# Patient Record
Sex: Male | Born: 1970 | Race: Black or African American | Hispanic: No | Marital: Single | Smoking: Current every day smoker
Health system: Southern US, Community
[De-identification: ages and names within clinical notes are randomized; demographics above are authoritative.]

## PROBLEM LIST (undated history)

## (undated) DIAGNOSIS — F329 Major depressive disorder, single episode, unspecified: Secondary | ICD-10-CM

## (undated) DIAGNOSIS — F32A Depression, unspecified: Secondary | ICD-10-CM

## (undated) DIAGNOSIS — F1010 Alcohol abuse, uncomplicated: Secondary | ICD-10-CM

---

## 2013-12-09 ENCOUNTER — Emergency Department (HOSPITAL_COMMUNITY): Payer: BC Managed Care – PPO

## 2013-12-09 ENCOUNTER — Emergency Department (HOSPITAL_COMMUNITY)
Admission: EM | Admit: 2013-12-09 | Discharge: 2013-12-11 | Disposition: A | Discharge status: Home / Self Care | Payer: BC Managed Care – PPO | Attending: Emergency Medicine | Admitting: Emergency Medicine

## 2013-12-09 ENCOUNTER — Encounter (HOSPITAL_COMMUNITY): Payer: Self-pay | Admitting: Emergency Medicine

## 2013-12-09 DIAGNOSIS — R45851 Suicidal ideations: Secondary | ICD-10-CM | POA: Diagnosis present

## 2013-12-09 DIAGNOSIS — F10129 Alcohol abuse with intoxication, unspecified: Secondary | ICD-10-CM | POA: Insufficient documentation

## 2013-12-09 DIAGNOSIS — Z4802 Encounter for removal of sutures: Secondary | ICD-10-CM | POA: Diagnosis not present

## 2013-12-09 DIAGNOSIS — Z720 Tobacco use: Secondary | ICD-10-CM | POA: Diagnosis not present

## 2013-12-09 DIAGNOSIS — F10239 Alcohol dependence with withdrawal, unspecified: Secondary | ICD-10-CM | POA: Diagnosis present

## 2013-12-09 DIAGNOSIS — F1010 Alcohol abuse, uncomplicated: Secondary | ICD-10-CM

## 2013-12-09 DIAGNOSIS — F329 Major depressive disorder, single episode, unspecified: Secondary | ICD-10-CM | POA: Insufficient documentation

## 2013-12-09 DIAGNOSIS — F1994 Other psychoactive substance use, unspecified with psychoactive substance-induced mood disorder: Secondary | ICD-10-CM | POA: Diagnosis present

## 2013-12-09 DIAGNOSIS — F10939 Alcohol use, unspecified with withdrawal, unspecified: Secondary | ICD-10-CM | POA: Diagnosis present

## 2013-12-09 DIAGNOSIS — R0902 Hypoxemia: Secondary | ICD-10-CM | POA: Diagnosis not present

## 2013-12-09 DIAGNOSIS — Z79899 Other long term (current) drug therapy: Secondary | ICD-10-CM | POA: Diagnosis not present

## 2013-12-09 HISTORY — DX: Major depressive disorder, single episode, unspecified: F32.9

## 2013-12-09 HISTORY — DX: Alcohol abuse, uncomplicated: F10.10

## 2013-12-09 HISTORY — DX: Depression, unspecified: F32.A

## 2013-12-09 LAB — CBC
HEMATOCRIT: 38.4 % — AB (ref 39.0–52.0)
HEMOGLOBIN: 12.9 g/dL — AB (ref 13.0–17.0)
MCH: 27.2 pg (ref 26.0–34.0)
MCHC: 33.6 g/dL (ref 30.0–36.0)
MCV: 81 fL (ref 78.0–100.0)
Platelets: 281 10*3/uL (ref 150–400)
RBC: 4.74 MIL/uL (ref 4.22–5.81)
RDW: 13.4 % (ref 11.5–15.5)
WBC: 6.5 10*3/uL (ref 4.0–10.5)

## 2013-12-09 LAB — COMPREHENSIVE METABOLIC PANEL
ALK PHOS: 60 U/L (ref 39–117)
ALT: 22 U/L (ref 0–53)
AST: 30 U/L (ref 0–37)
Albumin: 4.2 g/dL (ref 3.5–5.2)
Anion gap: 20 — ABNORMAL HIGH (ref 5–15)
BUN: 9 mg/dL (ref 6–23)
CO2: 26 mEq/L (ref 19–32)
Calcium: 9 mg/dL (ref 8.4–10.5)
Chloride: 105 mEq/L (ref 96–112)
Creatinine, Ser: 0.86 mg/dL (ref 0.50–1.35)
GFR calc non Af Amer: >90 mL/min (ref >90)
GLUCOSE: 90 mg/dL (ref 70–99)
POTASSIUM: 4 meq/L (ref 3.7–5.3)
SODIUM: 151 meq/L — AB (ref 137–147)
Total Bilirubin: <0.2 mg/dL — ABNORMAL LOW (ref 0.3–1.2)
Total Protein: 7.4 g/dL (ref 6.0–8.3)

## 2013-12-09 LAB — SALICYLATE LEVEL: Salicylate Lvl: <2 mg/dL — ABNORMAL LOW (ref 2.8–20.0)

## 2013-12-09 LAB — RAPID URINE DRUG SCREEN, HOSP PERFORMED
AMPHETAMINES: NOT DETECTED
BARBITURATES: NOT DETECTED
Benzodiazepines: NOT DETECTED
Cocaine: NOT DETECTED
Opiates: NOT DETECTED
TETRAHYDROCANNABINOL: NOT DETECTED

## 2013-12-09 LAB — I-STAT CHEM 8, ED
BUN: 7 mg/dL (ref 6–23)
Calcium, Ion: 1.08 mmol/L — ABNORMAL LOW (ref 1.12–1.23)
Chloride: 98 mEq/L (ref 96–112)
Creatinine, Ser: 1.1 mg/dL (ref 0.50–1.35)
Glucose, Bld: 98 mg/dL (ref 70–99)
HEMATOCRIT: 39 % (ref 39.0–52.0)
HEMOGLOBIN: 13.3 g/dL (ref 13.0–17.0)
POTASSIUM: 3.7 meq/L (ref 3.7–5.3)
Sodium: 143 mEq/L (ref 137–147)
TCO2: 27 mmol/L (ref 0–100)

## 2013-12-09 LAB — ETHANOL: Alcohol, Ethyl (B): 317 mg/dL — ABNORMAL HIGH (ref 0–11)

## 2013-12-09 LAB — ACETAMINOPHEN LEVEL

## 2013-12-09 MED ORDER — ONDANSETRON HCL 4 MG PO TABS
4.0000 mg | ORAL_TABLET | Freq: Three times a day (TID) | ORAL | Status: DC | PRN
  Administered 2013-12-10: 4 mg via ORAL
  Filled 2013-12-09: qty 1

## 2013-12-09 MED ORDER — IBUPROFEN 200 MG PO TABS
600.0000 mg | ORAL_TABLET | Freq: Three times a day (TID) | ORAL | Status: DC | PRN
  Administered 2013-12-09 – 2013-12-11 (×4): 600 mg via ORAL
  Filled 2013-12-09 (×4): qty 3

## 2013-12-09 MED ORDER — ZOLPIDEM TARTRATE 5 MG PO TABS: 5.0000 mg | ORAL_TABLET | Freq: Every evening | ORAL | Status: DC | PRN

## 2013-12-09 MED ORDER — ONDANSETRON 4 MG PO TBDP
4.0000 mg | ORAL_TABLET | Freq: Once | ORAL | Status: AC
  Administered 2013-12-09: 4 mg via ORAL
  Filled 2013-12-09: qty 1

## 2013-12-09 MED ORDER — LORAZEPAM 1 MG PO TABS
1.0000 mg | ORAL_TABLET | Freq: Once | ORAL | Status: AC
  Administered 2013-12-09: 1 mg via ORAL
  Filled 2013-12-09: qty 1

## 2013-12-09 MED ORDER — LORAZEPAM 1 MG PO TABS
0.0000 mg | ORAL_TABLET | Freq: Four times a day (QID) | ORAL | Status: DC
  Administered 2013-12-09: 1 mg via ORAL
  Administered 2013-12-10: 2 mg via ORAL
  Administered 2013-12-10: 1 mg via ORAL
  Filled 2013-12-09: qty 2
  Filled 2013-12-09 (×2): qty 1

## 2013-12-09 MED ORDER — ACETAMINOPHEN 325 MG PO TABS
650.0000 mg | ORAL_TABLET | ORAL | Status: DC | PRN
  Administered 2013-12-10 (×3): 650 mg via ORAL
  Filled 2013-12-09 (×3): qty 2

## 2013-12-09 MED ORDER — ALUM & MAG HYDROXIDE-SIMETH 200-200-20 MG/5ML PO SUSP: 30.0000 mL | ORAL | Status: DC | PRN

## 2013-12-09 MED ORDER — NICOTINE 21 MG/24HR TD PT24
21.0000 mg | MEDICATED_PATCH | Freq: Every day | TRANSDERMAL | Status: DC
  Administered 2013-12-09 – 2013-12-10 (×2): 21 mg via TRANSDERMAL
  Filled 2013-12-09 (×2): qty 1

## 2013-12-09 MED ORDER — METHOCARBAMOL 500 MG PO TABS
1000.0000 mg | ORAL_TABLET | Freq: Once | ORAL | Status: AC
  Administered 2013-12-09: 1000 mg via ORAL
  Filled 2013-12-09: qty 2

## 2013-12-09 MED ORDER — GABAPENTIN 300 MG PO CAPS
300.0000 mg | ORAL_CAPSULE | Freq: Three times a day (TID) | ORAL | Status: DC
  Administered 2013-12-09 – 2013-12-11 (×6): 300 mg via ORAL
  Filled 2013-12-09 (×7): qty 1

## 2013-12-09 MED ORDER — LORAZEPAM 1 MG PO TABS: 0.0000 mg | ORAL_TABLET | Freq: Two times a day (BID) | ORAL | Status: DC

## 2013-12-09 MED ORDER — NICOTINE 14 MG/24HR TD PT24
14.0000 mg | MEDICATED_PATCH | Freq: Every day | TRANSDERMAL | Status: DC
  Administered 2013-12-11: 14 mg via TRANSDERMAL
  Filled 2013-12-09 (×2): qty 1

## 2013-12-09 NOTE — BH Assessment (Signed)
Assessment completed. Consulted Nanine MeansJamison Lord, NP who agrees that pt needs inpatient treatment. [facility] at capacity. TTS will contact other facilities for placement. Joni ReiningNicole Pisciotta, PA-C has been informed of the recommendation.

## 2013-12-09 NOTE — ED Notes (Signed)
Pt stayed between 99-97% while ambulating.
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
## 2013-12-09 NOTE — ED Provider Notes (Signed)
Medical screening examination/treatment/procedure(s) were performed by non-physician practitioner and as supervising physician I was immediately available for consultation/collaboration.   EKG Interpretation   Date/Time:  Saturday December 09 2013 17:07:16 EST Ventricular Rate:  84 PR Interval:  138 QRS Duration: 97 QT Interval:  377 QTC Calculation: 446 R Axis:   22 Text Interpretation:  Sinus rhythm No old tracing to compare Confirmed by  [facility]  MD, TREY ([id number]) on 12/09/2013 5:50:32 PM      \43 year old male with history of alcoholism who presents with suicidal ideation and request for alcohol detox.On exam, well appearing, nontoxic, not distressed, normal respiratory effort, normal perfusion, depressed mood, withdrawn.  He has had some slight hypoxia, which appears secondary to alcohol intoxication, when stimulated, speaking, or ambulating, his O2 sats are normal.  Plan psych eval.   Clinical Impression: 1. Alcohol abuse   2. Hypoxic   3. Suicidal intent       Warnell Foresterrey Audi Conover, MD 12/09/13 [phone]

## 2013-12-09 NOTE — ED Provider Notes (Signed)
CSN: [id number]     Arrival date & time 12/09/13  1513 History   First MD Initiated Contact with Patient 12/09/13 1608     Chief Complaint  Patient presents with  . etoh detox   . Suicidal     (Consider location/radiation/quality/duration/timing/severity/associated sxs/prior Treatment) HPI   Evan [name] is a 43 y.o. male requesting detox from alcohol. 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 Patient is also complaining of low back pain which is chronic, rated at 7 out of 10, exacerbated by movement and palpation. Patient denies suicidal ideation however, triage note states that he is suicidal with plan to  jump in front of a train. He denies auditory or visual hallucinations, illicit drug use, chest pain, shortness of breath, homicidal ideation bowel or bladder habits. Reports productive cough.   Patient states he has a cyst removed from left neck approximately 2 weeks ago in [place], no complaints with respect to incision site  Past Medical History  Diagnosis Date  . Alcohol abuse    No past surgical history on file. No family history on file. History  Substance Use Topics  . Smoking status: Current Every Day Smoker  . Smokeless tobacco: Not on file  . Alcohol Use: Yes     Comment: 4-5 pints a day    Review of Systems  10 systems reviewed and found to be negative, except as noted in the HPI.   Allergies  Review of patient's allergies indicates no known allergies.  Home Medications   Prior to Admission medications   Medication Sig Start Date End Date Taking? Authorizing Provider  gabapentin (NEURONTIN) 300 MG capsule Take 300 mg by mouth 3 (three) times daily.   Yes Historical Provider, MD   BP 119/69 mmHg  Pulse 85  Temp(Src) 97.3 F (36.3 C) (Oral)  Resp 20  Ht 6\' 1"  (1.854 m)  Wt 190 lb (86.183 kg)   BMI 25.07 kg/m2  SpO2 91% Physical Exam  Constitutional: He is oriented to person, place, and time. He appears well-developed and well-nourished. No distress.  HENT:  Head: Normocephalic and atraumatic.  Mouth/Throat: Oropharynx is clear and moist.  + Tongue fasciculations   Eyes: Conjunctivae and EOM are normal. Pupils are equal, round, and reactive to light.  Neck:  6 cm well-healing incision to left lateral neck, aches running Ethilon sutures in place, clean dry intact no induration tenderness or discharge.  Cardiovascular: Normal rate.   Pulmonary/Chest: Effort normal. No stridor.  Musculoskeletal: Normal range of motion.  Neurological: He is alert and oriented to person, place, and time.  Psychiatric: His affect is blunt. His speech is delayed. He is slowed and withdrawn. He exhibits a depressed mood. He expresses suicidal ideation. He expresses suicidal plans. He is noncommunicative.  Nursing note and vitals reviewed.   ED Course  SUTURE REMOVAL Date/Time: 12/09/2013 4:59 PM Performed by: Wynetta Emery Authorized by: Wynetta Emery Consent: Verbal consent obtained. Consent given by: patient Patient identity confirmed: verbally with patient Body area: head/neck Wound Appearance: clean Sutures Removed: 8 Patient tolerance: Patient tolerated the procedure well with no immediate complications   (including critical care time) Labs Review Labs Reviewed  CBC - Abnormal; Notable for the following:    Hemoglobin 12.9 (*)    HCT 38.4 (*)    All other components within normal limits  COMPREHENSIVE METABOLIC PANEL - Abnormal; Notable for the following:    Sodium 151 (*)    Total Bilirubin <0.2 (*)    Anion gap 20 (*)    All other components within normal limits  ETHANOL - Abnormal; Notable for the following:    Alcohol, Ethyl (B) 317 (*)    All other components within normal limits  SALICYLATE LEVEL - Abnormal; Notable for the following:    Salicylate Lvl <2.0 (*)     All other components within normal limits  I-STAT CHEM 8, ED - Abnormal; Notable for the following:    Calcium, Ion 1.08 (*)    All other components within normal limits  ACETAMINOPHEN LEVEL  URINE RAPID DRUG SCREEN (HOSP PERFORMED)    Imaging Review Dg Chest Port 1 View  12/09/2013   CLINICAL DATA:  Hypoxia today, initial evaluation, no cardiopulmonary history  EXAM: PORTABLE CHEST - 1 VIEW  COMPARISON:  None.  FINDINGS: The heart size and mediastinal contours are within normal limits. Both lungs are clear. The visualized skeletal structures are unremarkable.  IMPRESSION: No active disease.   Electronically Signed   By: Esperanza Heiraymond  Rubner M.D.   On: 12/09/2013 17:47     EKG Interpretation   Date/Time:  Saturday December 09 2013 17:07:16 EST Ventricular Rate:  84 PR Interval:  138 QRS Duration: 97 QT Interval:  377 QTC Calculation: 446 R Axis:   22 Text Interpretation:  Sinus rhythm No old tracing to compare Confirmed by  [facility]  MD, TREY ([id number]) on 12/09/2013 5:50:32 PM      MDM   Final diagnoses:  Hypoxic  Alcohol abuse  Suicidal intent    Filed Vitals:   12/09/13 1800 12/09/13 1900 12/09/13 1930 12/09/13 2000  BP: 125/64 121/62 123/63 119/69  Pulse: 96 98 98 85  Temp:      TempSrc:      Resp: 18 23 19 20   Height:      Weight:      SpO2: 95% 90% 89% 91%    Medications  nicotine (NICODERM CQ - dosed in mg/24 hours) patch 14 mg (14 mg Transdermal Not Given 12/09/13 1654)  alum & mag hydroxide-simeth (MAALOX/MYLANTA) 200-200-20 MG/5ML suspension 30 mL (not administered)  ondansetron (ZOFRAN) tablet 4 mg (not administered)  nicotine (NICODERM CQ - dosed in mg/24 hours) patch 21 mg (not administered)  zolpidem (AMBIEN) tablet 5 mg (not administered)  ibuprofen (ADVIL,MOTRIN) tablet 600 mg (not administered)  acetaminophen (TYLENOL) tablet 650 mg (not administered)  LORazepam (ATIVAN) tablet 0-4 mg (not administered)    Followed by  LORazepam (ATIVAN) tablet 0-4  mg (not administered)  gabapentin (NEURONTIN) capsule 300 mg (not administered)  methocarbamol (ROBAXIN) tablet 1,000 mg (1,000 mg Oral Given 12/09/13 1650)  ondansetron (ZOFRAN-ODT) disintegrating tablet 4 mg (4 mg Oral Given 12/09/13 1651)  LORazepam (ATIVAN) tablet 1 mg (1 mg Oral Given 12/09/13 1649)    Evan [name] is a 43 y.o. male presenting with SI with plan and Requesting detox from alcohol.patient became hypoxic to 88% while lying in bed. He is placed on oxygen and responded well, chest x-ray pending. I have removed oxygen patient remained about 94%, will ambulate with evaluation of O2 sat.   Patient maintains a saturation of ambulation. Alcohol is elevated at 300. Patient has an anion gap of 20, likely secondary to dehydration. Recheck on i-STAT chem 8 shows that the gap is closing to 18  After patient has ate and drank.  Patient is medically cleared for psychiatric  evaluation will be transferred to the psych ED. TTS consulted, home meds and psych standard holding orders placed.       Wynetta Emeryicole Ulysses Alper, PA-C 12/09/13 2014  Warnell Foresterrey Wofford, MD 12/09/13 2049

## 2013-12-09 NOTE — ED Notes (Signed)
Pt states that he normally drinks 4-5 pints of etoh.  Pt states that he is suicidal with plan to jump in front of train.  Pt appears paranoid in triage.  Pt is concerned that this writer is typing about him.  Denies HI.  Last drink was around 11 am today.

## 2013-12-10 DIAGNOSIS — F1024 Alcohol dependence with alcohol-induced mood disorder: Secondary | ICD-10-CM

## 2013-12-10 MED ORDER — VITAMIN B-1 100 MG PO TABS
100.0000 mg | ORAL_TABLET | Freq: Every day | ORAL | Status: DC
  Administered 2013-12-11: 100 mg via ORAL
  Filled 2013-12-10: qty 1

## 2013-12-10 MED ORDER — CHLORDIAZEPOXIDE HCL 25 MG PO CAPS: 25.0000 mg | ORAL_CAPSULE | Freq: Three times a day (TID) | ORAL | Status: DC

## 2013-12-10 MED ORDER — CHLORDIAZEPOXIDE HCL 25 MG PO CAPS: 25.0000 mg | ORAL_CAPSULE | ORAL | Status: DC

## 2013-12-10 MED ORDER — CHLORDIAZEPOXIDE HCL 25 MG PO CAPS
25.0000 mg | ORAL_CAPSULE | Freq: Four times a day (QID) | ORAL | Status: DC | PRN
  Administered 2013-12-11: 25 mg via ORAL
  Filled 2013-12-10: qty 1

## 2013-12-10 MED ORDER — THIAMINE HCL 100 MG/ML IJ SOLN
100.0000 mg | Freq: Once | INTRAMUSCULAR | Status: AC
  Administered 2013-12-10: 100 mg via INTRAMUSCULAR
  Filled 2013-12-10: qty 2

## 2013-12-10 MED ORDER — CHLORDIAZEPOXIDE HCL 25 MG PO CAPS
25.0000 mg | ORAL_CAPSULE | Freq: Four times a day (QID) | ORAL | Status: DC
  Administered 2013-12-10 – 2013-12-11 (×3): 25 mg via ORAL
  Filled 2013-12-10 (×3): qty 1

## 2013-12-10 MED ORDER — CHLORDIAZEPOXIDE HCL 25 MG PO CAPS: 25.0000 mg | ORAL_CAPSULE | Freq: Every day | ORAL | Status: DC

## 2013-12-10 NOTE — ED Notes (Signed)
Pt states he does not know if he is SI.

## 2013-12-10 NOTE — Discharge Instructions (Signed)
Alcohol Use Disorder Alcohol use disorder is a mental disorder. It is not a one-time incident of heavy drinking. Alcohol use disorder is the excessive and uncontrollable use of alcohol over time that leads to problems with functioning in one or more areas of daily living. People with this disorder risk harming themselves and others when they drink to excess. Alcohol use disorder also can cause other mental disorders, such as mood and anxiety disorders, and serious physical problems. People with alcohol use disorder often misuse other drugs.  Alcohol use disorder is common and widespread. Some people with this disorder drink alcohol to cope with or escape from negative life events. Others drink to relieve chronic pain or symptoms of mental illness. People with a family history of alcohol use disorder are at higher risk of losing control and using alcohol to excess.  SYMPTOMS  Signs and symptoms of alcohol use disorder may include the following:   Consumption ofalcohol inlarger amounts or over a longer period of time than intended.  Multiple unsuccessful attempts to cutdown or control alcohol use.   A great deal of time spent obtaining alcohol, using alcohol, or recovering from the effects of alcohol (hangover).  A strong desire or urge to use alcohol (cravings).   Continued use of alcohol despite problems at work, school, or home because of alcohol use.   Continued use of alcohol despite problems in relationships because of alcohol use.  Continued use of alcohol in situations when it is physically hazardous, such as driving a car.  Continued use of alcohol despite awareness of a physical or psychological problem that is likely related to alcohol use. Physical problems related to alcohol use can involve the brain, heart, liver, stomach, and intestines. Psychological problems related to alcohol use include intoxication, depression, anxiety, psychosis, delirium, and dementia.   The need for  increased amounts of alcohol to achieve the same desired effect, or a decreased effect from the consumption of the same amount of alcohol (tolerance).  Withdrawal symptoms upon reducing or stopping alcohol use, or alcohol use to reduce or avoid withdrawal symptoms. Withdrawal symptoms include:  Racing heart.  Hand tremor.  Difficulty sleeping.  Nausea.  Vomiting.  Hallucinations.  Restlessness.  Seizures. DIAGNOSIS Alcohol use disorder is diagnosed through an assessment by your health care provider. Your health care provider may start by asking three or four questions to screen for excessive or problematic alcohol use. To confirm a diagnosis of alcohol use disorder, at least two symptoms must be present within a 12-month period. The severity of alcohol use disorder depends on the number of symptoms:  Mild--two or three.  Moderate--four or five.  Severe--six or more. Your health care provider may perform a physical exam or use results from lab tests to see if you have physical problems resulting from alcohol use. Your health care provider may refer you to a mental health professional for evaluation. TREATMENT  Some people with alcohol use disorder are able to reduce their alcohol use to low-risk levels. Some people with alcohol use disorder need to quit drinking alcohol. When necessary, mental health professionals with specialized training in substance use treatment can help. Your health care provider can help you decide how severe your alcohol use disorder is and what type of treatment you need. The following forms of treatment are available:   Detoxification. Detoxification involves the use of prescription medicines to prevent alcohol withdrawal symptoms in the first week after quitting. This is important for people with a history of symptoms   of withdrawal and for heavy drinkers who are likely to have withdrawal symptoms. Alcohol withdrawal can be dangerous and, in severe cases, cause  death. Detoxification is usually provided in a hospital or in-patient substance use treatment facility.  Counseling or talk therapy. Talk therapy is provided by substance use treatment counselors. It addresses the reasons people use alcohol and ways to keep them from drinking again. The goals of talk therapy are to help people with alcohol use disorder find healthy activities and ways to cope with life stress, to identify and avoid triggers for alcohol use, and to handle cravings, which can cause relapse.  Medicines.Different medicines can help treat alcohol use disorder through the following actions:  Decrease alcohol cravings.  Decrease the positive reward response felt from alcohol use.  Produce an uncomfortable physical reaction when alcohol is used (aversion therapy).  Support groups. Support groups are run by people who have quit drinking. They provide emotional support, advice, and guidance. These forms of treatment are often combined. Some people with alcohol use disorder benefit from intensive combination treatment provided by specialized substance use treatment centers. Both inpatient and outpatient treatment programs are available. Document Released: 02/20/2004 Document Revised: 05/29/2013 Document Reviewed: 04/21/2012 Exit[employer] Patient Information 2015 [employer]. This information is not intended to replace advice given to you by your health care provider. Make sure you discuss any questions you have with your health care provider.  

## 2013-12-10 NOTE — ED Provider Notes (Signed)
Pt medically clear, no clear SI.  He is willing to go to [facility] [place].  Olivia Mackielga M Tysha Grismore, MD 12/10/13 [phone]
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
## 2013-12-10 NOTE — Progress Notes (Signed)
MHT spoke with RN to report pt has been accepted to [facility] for detox treatment.  He can arrive via [facility].  Note, MHT made RN aware if pt is IVC is unable to go to [facility], as EPIC documents note he is voluntary.  Blain PaisMichelle L Lynann Demetrius, MHT/NS

## 2013-12-10 NOTE — BH Assessment (Signed)
Received a call from disposition tech who reported that pt has been accepted to [facility]. Spoke with pt about completing treatment at [facility]. Pt is willing to sign himself into treatment.

## 2013-12-10 NOTE — ED Notes (Signed)
Pt informed [place] [facility] [place]. [facility] called to transport pt.

## 2013-12-10 NOTE — ED Notes (Signed)
Pt.'s personal belongings in a bag ; 1 pair of jeans/pants, a gray long sleeves shirt , black bonnet, white undershirt , 1 pair of black / w/ orange stripe  Shoes kept in locker #27.

## 2013-12-10 NOTE — Progress Notes (Signed)
MHT contacted the following detox facilities for placement:  [facility] 1[facility] 3)[facility] 4)[facility]  FAX REFERRALS 1)[facility]  DECLINED 1)[facility]-unable to accept private insurance  Blain PaisMichelle L Kimmarie Pascale, MHT/NS

## 2013-12-11 ENCOUNTER — Observation Stay (HOSPITAL_COMMUNITY)
Admission: AD | Admit: 2013-12-11 | Discharge: 2013-12-12 | Disposition: A | Discharge status: Home / Self Care | Payer: BC Managed Care – PPO | Source: Intra-hospital | Attending: Psychiatry | Admitting: Psychiatry

## 2013-12-11 ENCOUNTER — Encounter (HOSPITAL_COMMUNITY): Payer: Self-pay | Admitting: *Deleted

## 2013-12-11 ENCOUNTER — Encounter (HOSPITAL_COMMUNITY): Payer: Self-pay | Admitting: Psychiatry

## 2013-12-11 DIAGNOSIS — F10230 Alcohol dependence with withdrawal, uncomplicated: Secondary | ICD-10-CM

## 2013-12-11 DIAGNOSIS — R45851 Suicidal ideations: Secondary | ICD-10-CM

## 2013-12-11 DIAGNOSIS — F1020 Alcohol dependence, uncomplicated: Principal | ICD-10-CM | POA: Insufficient documentation

## 2013-12-11 DIAGNOSIS — F329 Major depressive disorder, single episode, unspecified: Secondary | ICD-10-CM | POA: Diagnosis not present

## 2013-12-11 DIAGNOSIS — F1994 Other psychoactive substance use, unspecified with psychoactive substance-induced mood disorder: Secondary | ICD-10-CM | POA: Diagnosis present

## 2013-12-11 DIAGNOSIS — F1021 Alcohol dependence, in remission: Secondary | ICD-10-CM | POA: Diagnosis present

## 2013-12-11 DIAGNOSIS — F10939 Alcohol use, unspecified with withdrawal, unspecified: Secondary | ICD-10-CM | POA: Diagnosis present

## 2013-12-11 DIAGNOSIS — F1010 Alcohol abuse, uncomplicated: Secondary | ICD-10-CM | POA: Diagnosis present

## 2013-12-11 DIAGNOSIS — F10239 Alcohol dependence with withdrawal, unspecified: Secondary | ICD-10-CM | POA: Diagnosis present

## 2013-12-11 MED ORDER — CHLORDIAZEPOXIDE HCL 25 MG PO CAPS: 25.0000 mg | ORAL_CAPSULE | Freq: Three times a day (TID) | ORAL | Status: DC

## 2013-12-11 MED ORDER — CHLORDIAZEPOXIDE HCL 25 MG PO CAPS
25.0000 mg | ORAL_CAPSULE | Freq: Four times a day (QID) | ORAL | Status: DC
  Administered 2013-12-11 – 2013-12-12 (×4): 25 mg via ORAL
  Filled 2013-12-11 (×3): qty 1

## 2013-12-11 MED ORDER — CHLORDIAZEPOXIDE HCL 25 MG PO CAPS: 25.0000 mg | ORAL_CAPSULE | Freq: Every day | ORAL | Status: DC

## 2013-12-11 MED ORDER — MAGNESIUM HYDROXIDE 400 MG/5ML PO SUSP: 30.0000 mL | Freq: Every day | ORAL | Status: DC | PRN

## 2013-12-11 MED ORDER — ADULT MULTIVITAMIN W/MINERALS CH
1.0000 | ORAL_TABLET | Freq: Every day | ORAL | Status: DC
  Administered 2013-12-11 – 2013-12-12 (×2): 1 via ORAL
  Filled 2013-12-11 (×5): qty 1

## 2013-12-11 MED ORDER — VITAMIN B-1 100 MG PO TABS
100.0000 mg | ORAL_TABLET | Freq: Every day | ORAL | Status: DC
  Administered 2013-12-12: 100 mg via ORAL
  Filled 2013-12-11 (×3): qty 1

## 2013-12-11 MED ORDER — LOPERAMIDE HCL 2 MG PO CAPS
2.0000 mg | ORAL_CAPSULE | ORAL | Status: DC | PRN
  Administered 2013-12-12: 4 mg via ORAL
  Filled 2013-12-11: qty 2

## 2013-12-11 MED ORDER — HYDROXYZINE HCL 25 MG PO TABS
25.0000 mg | ORAL_TABLET | Freq: Four times a day (QID) | ORAL | Status: DC | PRN
  Administered 2013-12-11: 25 mg via ORAL
  Filled 2013-12-11: qty 1

## 2013-12-11 MED ORDER — CHLORDIAZEPOXIDE HCL 25 MG PO CAPS
25.0000 mg | ORAL_CAPSULE | Freq: Four times a day (QID) | ORAL | Status: DC | PRN
  Filled 2013-12-11: qty 1

## 2013-12-11 MED ORDER — CHLORDIAZEPOXIDE HCL 25 MG PO CAPS: 25.0000 mg | ORAL_CAPSULE | ORAL | Status: DC

## 2013-12-11 MED ORDER — ACETAMINOPHEN 325 MG PO TABS
650.0000 mg | ORAL_TABLET | Freq: Four times a day (QID) | ORAL | Status: DC | PRN
  Administered 2013-12-12: 650 mg via ORAL
  Filled 2013-12-11: qty 2

## 2013-12-11 MED ORDER — THIAMINE HCL 100 MG/ML IJ SOLN
100.0000 mg | Freq: Once | INTRAMUSCULAR | Status: AC
  Administered 2013-12-11: 100 mg via INTRAMUSCULAR
  Filled 2013-12-11: qty 2

## 2013-12-11 MED ORDER — ALUM & MAG HYDROXIDE-SIMETH 200-200-20 MG/5ML PO SUSP: 30.0000 mL | ORAL | Status: DC | PRN

## 2013-12-11 MED ORDER — NICOTINE 21 MG/24HR TD PT24
21.0000 mg | MEDICATED_PATCH | Freq: Every day | TRANSDERMAL | Status: DC
  Administered 2013-12-11 – 2013-12-12 (×2): 21 mg via TRANSDERMAL
  Filled 2013-12-11 (×5): qty 1

## 2013-12-11 MED ORDER — ONDANSETRON 4 MG PO TBDP: 4.0000 mg | ORAL_TABLET | Freq: Four times a day (QID) | ORAL | Status: DC | PRN

## 2013-12-11 NOTE — ED Notes (Signed)
Pt slept well during the night. C/o headache was given Tylenol with relief. Pt is to go to [facility] today. Facility requested we hold pt transfer last night due to insurance verification in which facility stated they would verify this morning. Pt is aware of potential facility transfer and circumstances surrounding insurance. Facility [place]. [facility], [facility], and charge nurse are aware of situation with pt transport. Pt voices no complaints at this time.
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
## 2013-12-11 NOTE — ED Notes (Signed)
To transport @ 1730 per Fannie KneeSue @ [facility] observation.

## 2013-12-11 NOTE — Progress Notes (Signed)
Patient in bed playing cards with peers. Denied SI/HI, denied Hallucinations and denied withdrawals. He requested for nicotine patch. Writer received his medications as ordered including Thiamine shot. Encouraged and supported patient. Q 15 minute check continues as ordered to maintain safety,

## 2013-12-11 NOTE — Progress Notes (Signed)
[facility] INPATIENT:  Family/Significant Other Suicide Prevention Education  Suicide Prevention Education:  Patient Refusal for Family/Significant Other Suicide Prevention Education: The patient Evan [name] has refused to provide written consent for family/significant other to be provided Family/Significant Other Suicide Prevention Education during admission and/or prior to discharge.  Physician notified.  Loren RacerMaggio, Eloyce Bultman J 12/11/2013, 6:25 PM

## 2013-12-11 NOTE — ED Notes (Addendum)
Facility states they would like to verify pt insurance and that it would be best if pt came in the morning so insurance can be verified at that time. MD and charge nurse aware.
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
## 2013-12-11 NOTE — Plan of Care (Signed)
[facility] Observation Crisis Plan  Reason for Crisis Plan:  Substance Abuse   Plan of Care:  Referral for Substance Abuse  Family Support:      Current Living Environment:  Living Arrangements: Alone  Insurance:   Hospital Account    Name Acct ID Class Status Primary Coverage   Alben [place], Javarion [id number] BEHAVIORAL HEALTH OBSERVATION Open BLUE CROSS BLUE SHIELD - BCBS PPO OUT OF STATE        Guarantor Account (for Hospital Account [id number])    Name Relation to Pt Service Area Active? Acct Type   Alben [place], Haidan Self CHSA Yes Behavioral Health   Address Phone       [address] [place], [place] [id number] [phone])          Coverage Information (for Hospital Account [id number])    F/O Payor/Plan Precert #   BLUE CROSS BLUE SHIELD/BCBS PPO OUT OF STATE    Subscriber Subscriber #   [place], Sharvil [id number]   Address Phone   [address] [place], [place] [address] [phone]      Legal Guardian:     Primary Care Provider:  PROVIDER NOT IN SYSTEM  Current Outpatient Providers:  unknown  Psychiatrist:     Counselor/Therapist:     Compliant with Medications:  No  Additional Information:   Loren RacerMaggio, Chevis Weisensel J 11/16/20156:23 PM

## 2013-12-11 NOTE — ED Notes (Signed)
1 pt belonging bag in locker #27 

## 2013-12-11 NOTE — [facility] Counselor (Addendum)
Writer left voicemail for [employer] Crowley letting him know pt will be admitted to our OBS unit overnight. Writer requested on voicemail that [facility] keep pt's referral in case [facility] with [facility] wants to get pt referred to [facility]. Writer spoke w/ pt and pt states he did live in [place] but plans to live in [place] with his  Family in [place].  Evette Cristalaroline Paige Jonothan [place], [place] Assessment Counselor [phone]   TC to [facility] in [place] [phone]. Writer spoke with Braulio Conteay Crowley. He needs following questions answered: Is pt resident of [facility] - his address is in [place]? Is pt planning on staying in [place]? Is he going to go back to [place]? Ray reports pt's BCBS in through [place]. TTS will call Angelina OkCrowley back with answers.  Evette Cristalaroline Paige Guillermina Shaft, [place] Assessment Counselor [phone]

## 2013-12-11 NOTE — ED Provider Notes (Signed)
Pt stable awaiting placement  Evan [name] L Benigna Delisi, MD 12/11/13 0717 

## 2013-12-12 DIAGNOSIS — F1099 Alcohol use, unspecified with unspecified alcohol-induced disorder: Secondary | ICD-10-CM

## 2013-12-12 DIAGNOSIS — F1020 Alcohol dependence, uncomplicated: Secondary | ICD-10-CM | POA: Diagnosis not present

## 2013-12-12 MED ORDER — GABAPENTIN 300 MG PO CAPS: 300.0000 mg | ORAL_CAPSULE | Freq: Three times a day (TID) | ORAL | Status: AC

## 2013-12-12 NOTE — Progress Notes (Signed)
Patient ID: Alben DeedsDuncan [name], male   DOB: 1970/03/06, 43 y.o.   MRN: [id number] [facility].  Denies any suicidal ideation or thoughts of hurting others.  Also denies any auditory or visual hallucinations.  Reviewed all discharge instructions, medications and follow up care.  Verbalized understanding of all.  All belongings returned.  Patient left the unit ambulatory and was escorted to the lobby.
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
## 2013-12-12 NOTE — Discharge Instructions (Signed)
To help you maintain a sober lifestyle a substance abuse treatment facility or program may be beneficial to you.  You have expressed an interest in seeking admission to the [facility].  Contact them at your earliest opportunity to see about being admitted:       [facility]      [address]       [place], [place] [address]      [phone]

## 2013-12-12 NOTE — Plan of Care (Signed)
[facility] Observation Crisis Plan  Reason for Crisis Plan:  Substance Abuse   Plan of Care:  Referral for Substance Abuse  Family Support:    Cousin  Current Living Environment:  Living Arrangements: Alone; pt equivocates about whether or not he can return to household  Insurance:  [facility]    Name Acct ID Class Status Primary Coverage   [place], [place] [id number] BEHAVIORAL HEALTH OBSERVATION Open BLUE CROSS BLUE SHIELD - BCBS PPO OUT OF STATE        Guarantor Account (for Hospital Account [id number])    Name Relation to Pt Service Area Active? Acct Type   Alben [place]eds[place], Parsa Self CHSA Yes Behavioral Health   Address Phone       [address] [place], [place] [id number] [phone])          Coverage Information (for Hospital Account [id number])    F/O Payor/Plan Precert #   BLUE CROSS BLUE SHIELD/BCBS PPO OUT OF STATE    Subscriber Subscriber #   [place], [place] [id number]   Address Phone   [address] [place], [place] [address] [phone]      Legal Guardian:   Self  Primary Care Provider:  PROVIDER NOT IN SYSTEM; Melkit Workneh  Current Outpatient Providers:  None  Psychiatrist:   None  Counselor/Therapist:   None  Compliant with Medications:  Yes  Additional Information: After consulting with Claudette Headonrad Withrow, NP it has been determined that pt does not present a life threatening danger to himself or others, and that psychiatric hospitalization is not indicated for him at this time.  This Clinical research associatewriter offered my services seeking placement for him at a residential rehabilitation facility, but it is his preference to seek placement at the [facility] facility at his own initiative.  His discharge instructions will include contact information for [facility].  Doylene Canninghomas Bosco Paparella, MA Triage Specialist Raphael GibneyHughes, Comer Devins Patrick 11/17/20151:31 PM
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
## 2015-05-25 IMAGING — DX DG CHEST 1V PORT
1 series · 1 of 1 positions shown · non-contrast
Comparison: None.

CLINICAL DATA: Hypoxia today, initial evaluation, no
cardiopulmonary history

EXAM:
PORTABLE CHEST - 1 VIEW

[chest ap]
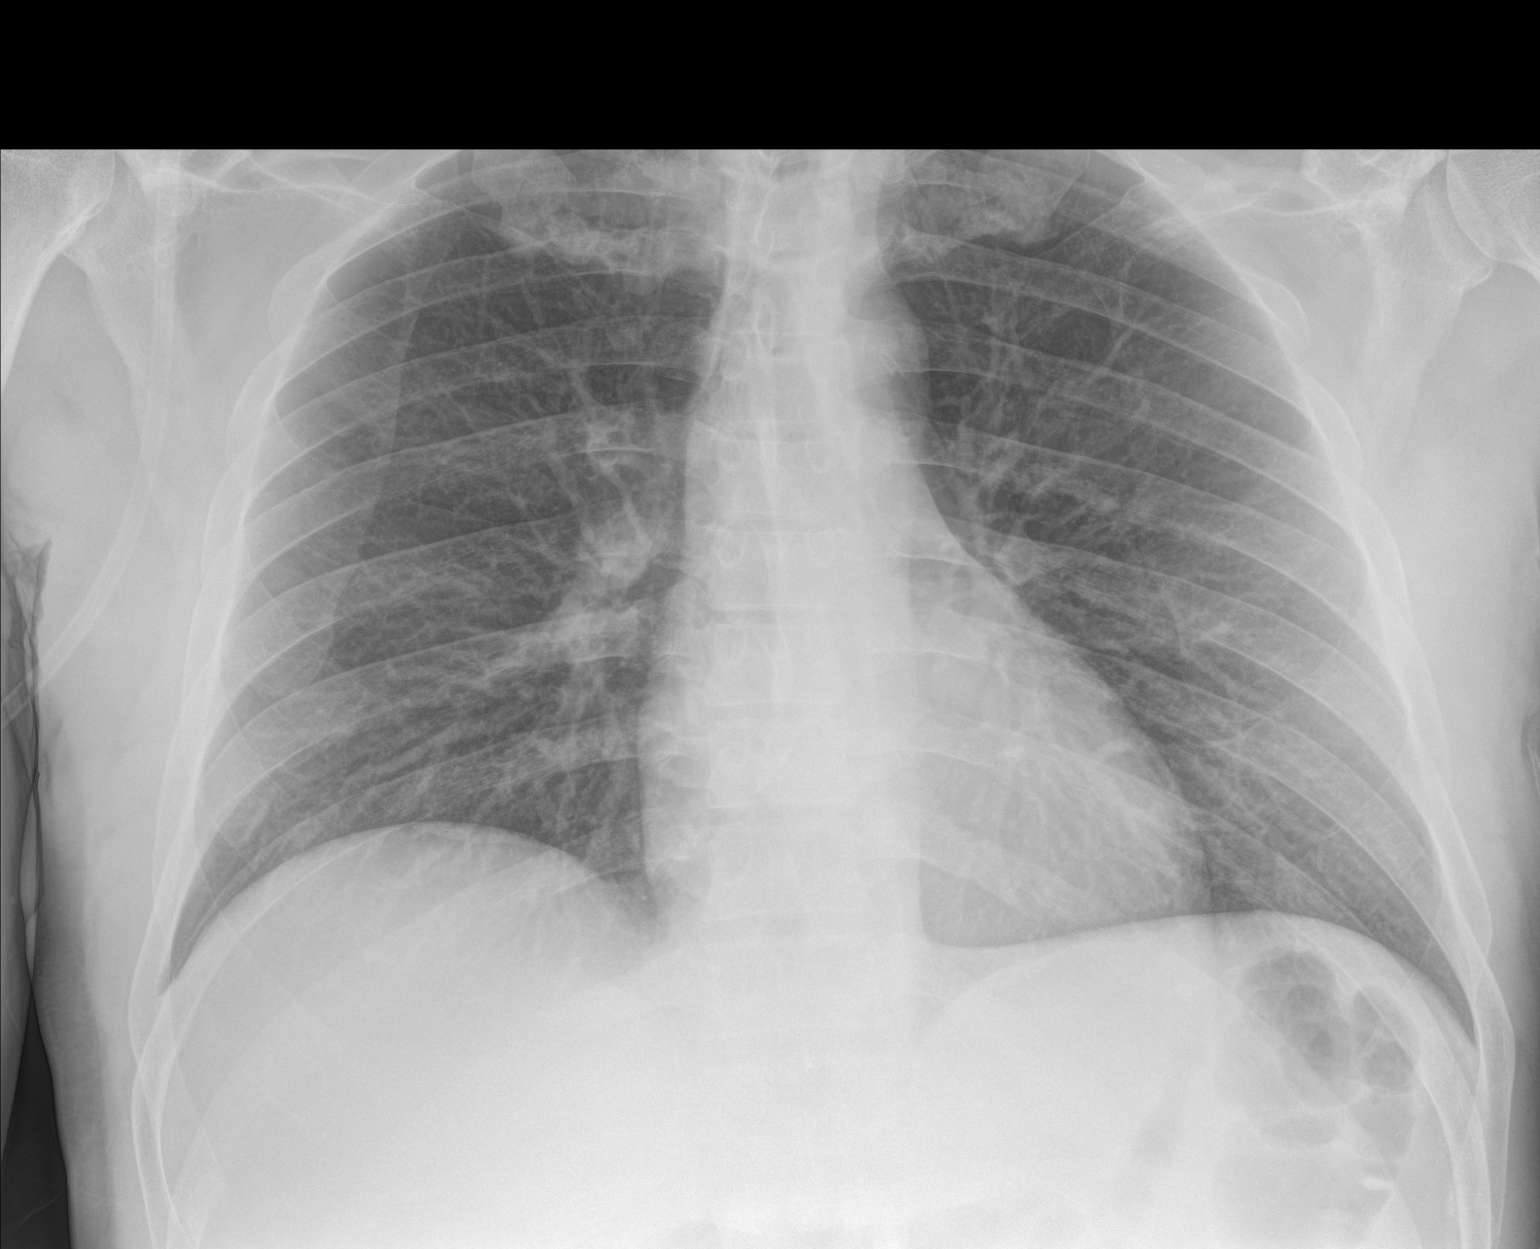

[1 of 1 positions shown; findings below may reference images not displayed]

FINDINGS: The heart size and mediastinal contours are within normal limits.
Both lungs are clear. The visualized skeletal structures are
unremarkable.
IMPRESSION: No active disease.
